# Patient Record
Sex: Male | Born: 1975 | Race: Black or African American | Hispanic: No | Marital: Single | Smoking: Never smoker
Health system: Southern US, Community
[De-identification: ages and names within clinical notes are randomized; demographics above are authoritative.]

## PROBLEM LIST (undated history)

## (undated) DIAGNOSIS — R7401 Elevation of levels of liver transaminase levels: Secondary | ICD-10-CM

## (undated) DIAGNOSIS — R740 Nonspecific elevation of levels of transaminase and lactic acid dehydrogenase [LDH]: Secondary | ICD-10-CM

## (undated) DIAGNOSIS — R030 Elevated blood-pressure reading, without diagnosis of hypertension: Secondary | ICD-10-CM

## (undated) DIAGNOSIS — R002 Palpitations: Secondary | ICD-10-CM

## (undated) HISTORY — DX: Palpitations: R00.2

## (undated) HISTORY — DX: Elevated blood-pressure reading, without diagnosis of hypertension: R03.0

## (undated) HISTORY — DX: Nonspecific elevation of levels of transaminase and lactic acid dehydrogenase (ldh): R74.0

## (undated) HISTORY — DX: Elevation of levels of liver transaminase levels: R74.01

---
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
## 2011-06-13 ENCOUNTER — Emergency Department: Payer: Self-pay | Admitting: Unknown Physician Specialty

## 2011-06-13 ENCOUNTER — Encounter: Payer: Self-pay | Admitting: Internal Medicine

## 2011-06-13 LAB — COMPREHENSIVE METABOLIC PANEL
Albumin: 4.4 g/dL (ref 3.4–5.0)
Alkaline Phosphatase: 79 U/L (ref 50–136)
Anion Gap: 11 (ref 7–16)
BUN: 12 mg/dL (ref 7–18)
Bilirubin,Total: 0.3 mg/dL (ref 0.2–1.0)
Chloride: 102 mmol/L (ref 98–107)
Creatinine: 1.04 mg/dL (ref 0.60–1.30)
EGFR (African American): >60
Glucose: 109 mg/dL — ABNORMAL HIGH (ref 65–99)
SGOT(AST): 38 U/L — ABNORMAL HIGH (ref 15–37)
SGPT (ALT): 84 U/L — ABNORMAL HIGH
Total Protein: 8.5 g/dL — ABNORMAL HIGH (ref 6.4–8.2)

## 2011-06-13 LAB — CBC
HGB: 13.6 g/dL (ref 13.0–18.0)
MCH: 30.3 pg (ref 26.0–34.0)
MCHC: 34.1 g/dL (ref 32.0–36.0)
Platelet: 247 10*3/uL (ref 150–440)
RBC: 4.48 10*6/uL (ref 4.40–5.90)
RDW: 13.1 % (ref 11.5–14.5)

## 2011-06-13 LAB — MAGNESIUM: Magnesium: 2.1 mg/dL

## 2011-06-13 LAB — CK TOTAL AND CKMB (NOT AT ARMC)
CK, Total: 354 U/L — ABNORMAL HIGH (ref 35–232)
CK-MB: 3.3 ng/mL (ref 0.5–3.6)

## 2011-06-13 LAB — TROPONIN I: Troponin-I: <0.02 ng/mL

## 2011-08-06 ENCOUNTER — Ambulatory Visit (INDEPENDENT_AMBULATORY_CARE_PROVIDER_SITE_OTHER): Payer: No Typology Code available for payment source | Admitting: Internal Medicine

## 2011-08-06 ENCOUNTER — Encounter: Payer: Self-pay | Admitting: Internal Medicine

## 2011-08-06 VITALS — BP 143/81 | HR 75 | Resp 18 | Ht 72.0 in | Wt 310.0 lb

## 2011-08-06 DIAGNOSIS — R030 Elevated blood-pressure reading, without diagnosis of hypertension: Secondary | ICD-10-CM

## 2011-08-06 DIAGNOSIS — R002 Palpitations: Secondary | ICD-10-CM | POA: Insufficient documentation

## 2011-08-06 MED ORDER — PROPRANOLOL HCL 10 MG PO TABS: ORAL_TABLET | ORAL | Status: DC

## 2011-08-06 NOTE — Patient Instructions (Addendum)
You being referred to : Dr. Leone Payor in the GI department for elevated liver enzymes.  Your physician has requested that you have an echocardiogram in the [place] office. Echocardiography is a painless test that uses sound waves to create images of your heart. It provides your doctor with information about the size and shape of your heart and how well your heart's chambers and valves are working. This procedure takes approximately one hour. There are no restrictions for this procedure.  Your physician has recommended you make the following change in your medication:  1) Start inderal 10 mg one tablet daily as needed for symptoms.

## 2011-08-06 NOTE — Assessment & Plan Note (Signed)
Mildly elevated. He is starting to exercise more. This should be followed up as an outpatient

## 2011-08-06 NOTE — Assessment & Plan Note (Signed)
1 these were repeatedly elevated in January. We will refer him to Dr. Leone Payor for further evaluation.

## 2011-08-06 NOTE — Assessment & Plan Note (Signed)
These are infrequent. The issue would be is there in association with abnormal heart muscle function I would have an impact on prognosis. We'll do an ultrasound. I have told him that if this is normal, it is simply try to whether the symptoms will recur. I will give him a prescription for a low-dose beta blocker to have as needed. Specifically propranolol 10 mg.

## 2011-08-06 NOTE — Progress Notes (Signed)
   History and Physical  Patient ID: Shane Rhodes MRN: [id number], SOB: [birth year] 36 y.o. Date of Encounter: 08/06/2011, 10:45 AM  Primary Physician: No primary provider on file.   Chief Complaint: Palpitations  History of Present Illness: Shane Rhodes is a 36 y.o. male who works for [employer] while traveling to [place] 2 months ago developed palpitations. They persisted and he went to the local emergency room where he was given a diagnosis of PVCs. Apparently one was seen on the monitor "a small 1". ECGs don't document anything. He then had an episode a week or 2 later that prompted him to go to [facility]; again nothing was documented. This had no spells since. These were associated with a heart pounding beat was not rapid. There is a sensation of the heartbeat in his throat. There was no lightheadedness.  He has had no problems with exercise intolerance. There is no edema syncope. He has become a vegetarian in the last 6 months and is starting to run for exercise.  His blood work is also notable for hyper transaminasemia that was noted on both blood tests.   Past Medical History  Diagnosis Date  . 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 Elevated transaminase level   . Borderline blood pressure      Past Surgical History  Procedure Date  . Orthopedic procedure       No current outpatient prescriptions on file.     Allergies: No Known Allergies   History  Substance Use Topics  . Smoking status: Never Smoker   . Smokeless tobacco: Not on file  . Alcohol Use: Yes      Negative FHX   ROS:  Please see the history of present illness.   Negative except anxiety  All other systems reviewed and negative.   Vital Signs: Blood pressure 143/81, pulse 75, resp. rate 18, height 6' (1.829 m), weight 310 lb (140.615 kg).  PHYSICAL EXAM: General:  Well nourished, well developed male in no acute distress  HEENT: normal Lymph: no adenopathy Neck:  no JVD Endocrine:  No thryomegaly Vascular: No carotid bruits; FA pulses 2+ bilaterally without bruits Cardiac:  normal S1, S2; RRR; no murmur Back: without kyphosis/scoliosis, no CVA tenderness Lungs:  clear to auscultation bilaterally, no wheezing, rhonchi or rales Abd: soft, nontender, no hepatomegaly Ext: no edema Musculoskeletal:  No deformities, BUE and BLE strength normal and equal Skin: warm and dry Neuro:  CNs 2-12 intact, no focal abnormalities noted Psych:  Normal affect   EKG:  From [facility] demonstrated sinus rhythm with normal intervals and normal voltage  Labs:      ASSESSMENT AND PLAN:

## 2011-08-24 ENCOUNTER — Other Ambulatory Visit: Payer: Self-pay

## 2011-08-24 ENCOUNTER — Other Ambulatory Visit (INDEPENDENT_AMBULATORY_CARE_PROVIDER_SITE_OTHER): Payer: No Typology Code available for payment source

## 2011-08-24 DIAGNOSIS — R002 Palpitations: Secondary | ICD-10-CM

## 2011-09-03 ENCOUNTER — Ambulatory Visit: Payer: No Typology Code available for payment source | Admitting: Internal Medicine

## 2011-09-24 ENCOUNTER — Encounter: Payer: Self-pay | Admitting: Internal Medicine

## 2011-09-24 ENCOUNTER — Ambulatory Visit (INDEPENDENT_AMBULATORY_CARE_PROVIDER_SITE_OTHER): Payer: No Typology Code available for payment source | Admitting: *Deleted

## 2011-09-24 ENCOUNTER — Ambulatory Visit (INDEPENDENT_AMBULATORY_CARE_PROVIDER_SITE_OTHER): Payer: No Typology Code available for payment source | Admitting: Internal Medicine

## 2011-09-24 VITALS — BP 124/88 | HR 84 | Ht 71.0 in | Wt 301.4 lb

## 2011-09-24 DIAGNOSIS — R748 Abnormal levels of other serum enzymes: Secondary | ICD-10-CM

## 2011-09-24 DIAGNOSIS — R6889 Other general symptoms and signs: Secondary | ICD-10-CM

## 2011-09-24 LAB — HEPATIC FUNCTION PANEL
ALT: 59 U/L — ABNORMAL HIGH (ref 0–53)
AST: 31 U/L (ref 0–37)
Albumin: 4.3 g/dL (ref 3.5–5.2)
Alkaline Phosphatase: 61 U/L (ref 39–117)
Total Bilirubin: 0.8 mg/dL (ref 0.3–1.2)

## 2011-09-24 NOTE — Patient Instructions (Signed)
Your physician has requested that you go to the basement for the following lab work before leaving today: LFT's, CK-Total  We will call you with the results.

## 2011-09-24 NOTE — Progress Notes (Signed)
  Subjective:    Patient ID: Shane Rhodes, male    DOB: [birth year], 35 y.o.   MRN: [id number]  HPI 36 yo AA man was travelling on 06/11/2011 and developed palpitations. He went from [place] to [place] to [place]. Went to [facility] in [place] where EKG was ok. Labs showed ALT 60 (top normal 52) and normal AST. Troponin ok. Felt better and returned here. [facility] labs 06/13/11 show ALT 84 (top normal 78) and AST 38 9top normal 37). A CK was elevated at 354 (232 top normal). He had been running regularly training for a 5 K during this time. No overt myalgias, though. No steroids, body-building supplements or weight-lifting. Rare and not much EtOH. No prior hx abnormal LFT's or liver disease.No IVDA, no transfusions.   Medications, allergies, past medical history, past surgical history, family history and social history are reviewed and updated in the EMR.  All other GI ROS negative. Review of Systems All other ROS negative except as in HPI    Objective:   Physical Exam General:  Well-developed, well-nourished and in no acute distress-obese Eyes:  anicteric. ENT:   Mouth and posterior pharynx free of lesions.  Neck:   supple w/o thyromegaly or mass.  Lungs: Clear to auscultation bilaterally. Heart:  S1S2, no rubs, murmurs, gallops. Abdomen:  soft, non-tender, no hepatosplenomegaly, hernia, or mass and BS+.  Lymph:  no cervical or supraclavicular adenopathy. Extremities:   no edema Neuro:  A&O x 3.  Psych:  appropriate mood and  Affect.   Data Reviewed:  Labs, EKG, ED notes from [facility] 06/11/2011 Labs from [facility] 06/13/11      Assessment & Plan:   1. Abnormal transaminases   2. Abnormal creatine kinase level    At this point suspect abnormal transaminases originated in skeletal muscle, perhaps from the running he was doing then. Will reassess LFT's and CK. Also consider non-alcoholic fatty liver as he is obese. If labs normal then probably one more repeat  lab test in a few months. If labs not normal may need further testing.  Lab Results  Component Value Date   ALT 59* 09/24/2011   AST 31 09/24/2011   ALKPHOS 61 09/24/2011   BILITOT 0.8 09/24/2011   Lab Results  Component Value Date   CKTOTAL 213 09/24/2011   CKMB 2.0 09/24/2011   Labs have returned with persistent mild abnormality of the ALT. Will contact him and check HCV antibody, fasting lipid panel, ferritin, ANA, Hep B surface Ag.   Cc: Duke Salvia, MD [address] [place]lyn [place], [place] [address]

## 2011-09-25 ENCOUNTER — Encounter: Payer: Self-pay | Admitting: Internal Medicine

## 2011-09-25 NOTE — Progress Notes (Signed)
Quick Note:  Let him know one of the liver tests still mildly elevated and the CK (muscle test) is normal So could have some liver issue  I want him to do a fasting lipid panel, Hepatitis B surface antigen, Hepatitis C antibody, ANA and a ferritin ______

## 2011-09-27 NOTE — Progress Notes (Signed)
Pt notified of lab results and informed we need more labs drawn per Dr. Leone Payor, pt will try and come today fasting.  Orders put in computer for fasting lipid panel, Hep. B surface antigen, Hep. C antibody, ANA, Ferritin.

## 2011-09-28 ENCOUNTER — Other Ambulatory Visit (INDEPENDENT_AMBULATORY_CARE_PROVIDER_SITE_OTHER): Payer: No Typology Code available for payment source

## 2011-09-28 DIAGNOSIS — R748 Abnormal levels of other serum enzymes: Secondary | ICD-10-CM

## 2011-09-28 LAB — LIPID PANEL: Cholesterol: 184 mg/dL (ref 0–200)

## 2011-09-28 LAB — HEPATITIS C ANTIBODY: HCV Ab: NEGATIVE

## 2011-09-29 LAB — ANA: Anti Nuclear Antibody(ANA): NEGATIVE

## 2011-09-30 NOTE — Progress Notes (Signed)
Quick Note:  Let him know that lab work-up negative for hep C and B Lipids not bad though LDL somewhat high Other tests (ferritin and ANA)are ok - no iron overload or autoimmune Abnormal tests most likely from fatty liver which is likely given his size [place] could show but I do not think he needs it unless desired  Recommend repeat LFT's in 3 months time and will call results and plans - overall do not think anything serious going on but we should follow-uop  Let me know if he has ?'s Send him fatty liver info ______

## 2011-10-01 ENCOUNTER — Other Ambulatory Visit: Payer: Self-pay

## 2011-10-01 DIAGNOSIS — R933 Abnormal findings on diagnostic imaging of other parts of digestive tract: Secondary | ICD-10-CM

## 2014-04-16 ENCOUNTER — Other Ambulatory Visit: Payer: Self-pay | Admitting: Orthopedic Surgery

## 2014-04-16 DIAGNOSIS — M25511 Pain in right shoulder: Secondary | ICD-10-CM

## 2014-04-29 ENCOUNTER — Ambulatory Visit
Admission: RE | Admit: 2014-04-29 | Discharge: 2014-04-29 | Disposition: A | Discharge status: Home / Self Care | Payer: BC Managed Care – PPO | Source: Ambulatory Visit | Attending: Orthopedic Surgery | Admitting: Orthopedic Surgery

## 2014-04-29 ENCOUNTER — Other Ambulatory Visit: Payer: Self-pay | Admitting: Orthopedic Surgery

## 2014-04-29 DIAGNOSIS — M25511 Pain in right shoulder: Secondary | ICD-10-CM

## 2014-04-29 MED ORDER — IOHEXOL 180 MG/ML  SOLN: 15.0000 mL | Freq: Once | INTRAMUSCULAR | Status: AC | PRN

## 2014-04-30 ENCOUNTER — Other Ambulatory Visit: Payer: Self-pay | Admitting: Orthopedic Surgery

## 2014-04-30 DIAGNOSIS — M25511 Pain in right shoulder: Secondary | ICD-10-CM

## 2014-04-30 DIAGNOSIS — M2560 Stiffness of unspecified joint, not elsewhere classified: Secondary | ICD-10-CM

## 2014-05-03 ENCOUNTER — Other Ambulatory Visit: Payer: No Typology Code available for payment source

## 2014-05-03 ENCOUNTER — Ambulatory Visit
Admission: RE | Admit: 2014-05-03 | Discharge: 2014-05-03 | Disposition: A | Discharge status: Home / Self Care | Payer: BC Managed Care – PPO | Source: Ambulatory Visit | Attending: Orthopedic Surgery | Admitting: Orthopedic Surgery

## 2014-05-03 ENCOUNTER — Inpatient Hospital Stay: Admission: RE | Admit: 2014-05-03 | Payer: No Typology Code available for payment source | Source: Ambulatory Visit

## 2014-05-03 DIAGNOSIS — M2560 Stiffness of unspecified joint, not elsewhere classified: Secondary | ICD-10-CM

## 2014-05-03 DIAGNOSIS — M25511 Pain in right shoulder: Secondary | ICD-10-CM

## 2014-05-03 MED ORDER — IOHEXOL 180 MG/ML  SOLN
15.0000 mL | Freq: Once | INTRAMUSCULAR | Status: AC | PRN
  Administered 2014-05-03: 15 mL via INTRA_ARTICULAR

## 2014-06-28 ENCOUNTER — Other Ambulatory Visit: Payer: Self-pay | Admitting: Physician Assistant

## 2014-06-28 DIAGNOSIS — R609 Edema, unspecified: Secondary | ICD-10-CM | POA: Insufficient documentation

## 2014-06-29 ENCOUNTER — Ambulatory Visit (HOSPITAL_COMMUNITY)
Admission: RE | Admit: 2014-06-29 | Discharge: 2014-06-29 | Disposition: A | Discharge status: Home / Self Care | Payer: BC Managed Care – PPO | Source: Ambulatory Visit | Attending: Physician Assistant | Admitting: Physician Assistant

## 2014-06-29 DIAGNOSIS — R609 Edema, unspecified: Secondary | ICD-10-CM

## 2014-06-29 NOTE — Progress Notes (Signed)
VASCULAR LAB PRELIMINARY  PRELIMINARY  PRELIMINARY  PRELIMINARY  Bilateral lower extremity venous Dopplers completed.    Preliminary report: There is no DVT or SVT noted in the bilateral lower extremities.   Danielys Madry, RVT 06/29/2014, 10:42 AM

## 2015-03-19 ENCOUNTER — Other Ambulatory Visit: Payer: Self-pay | Admitting: Orthopedic Surgery

## 2015-03-19 DIAGNOSIS — M25562 Pain in left knee: Secondary | ICD-10-CM

## 2015-03-29 ENCOUNTER — Other Ambulatory Visit: Payer: BC Managed Care – PPO

## 2015-03-30 ENCOUNTER — Inpatient Hospital Stay
Admission: RE | Admit: 2015-03-30 | Discharge: 2015-03-30 | Disposition: A | Discharge status: Home / Self Care | Payer: BC Managed Care – PPO | Source: Ambulatory Visit | Attending: Orthopedic Surgery | Admitting: Orthopedic Surgery

## 2015-04-13 ENCOUNTER — Ambulatory Visit
Admission: RE | Admit: 2015-04-13 | Discharge: 2015-04-13 | Disposition: A | Discharge status: Home / Self Care | Payer: BC Managed Care – PPO | Source: Ambulatory Visit | Attending: Orthopedic Surgery | Admitting: Orthopedic Surgery

## 2015-04-13 DIAGNOSIS — M25562 Pain in left knee: Secondary | ICD-10-CM

## 2017-07-07 IMAGING — MR MR KNEE*L* W/O CM
4 of 5 series · 29 of 40 positions shown · non-contrast
Comparison: None.

CLINICAL DATA: Left knee swelling and weakness for 2 months.
Swelling is mainly posterior. No known injury. Initial encounter.

EXAM:
MRI OF THE LEFT KNEE WITHOUT CONTRAST
TECHNIQUE: Multiplanar, multisequence MR imaging of the knee was performed. No
intravenous contrast was administered.

[Series 3: PD fat-sat · axial · 4.0mm · 0.66mm/px · z∈[-29,+81]mm · 9 of 26 slices shown (1 of 3)]
[im 1/26]
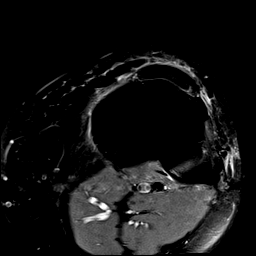
[im 4/26]
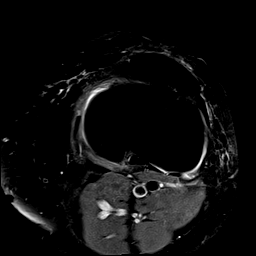
[im 7/26]
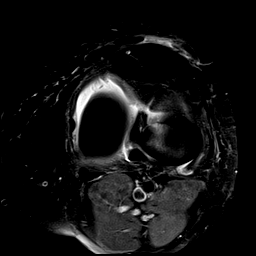
[im 10/26]
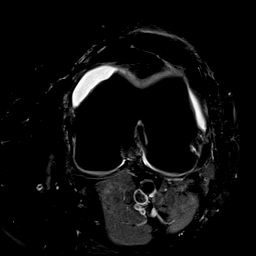
[im 13/26]
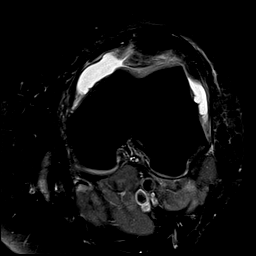
[im 16/26]
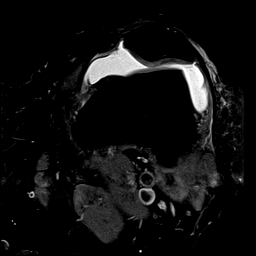
[im 19/26]
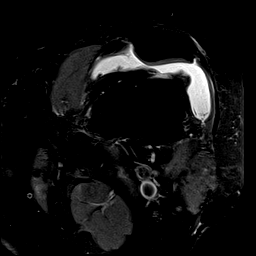
[im 22/26]
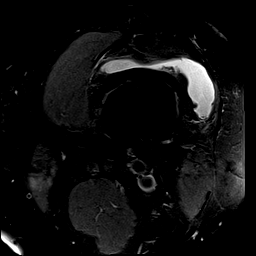
[im 26/26]
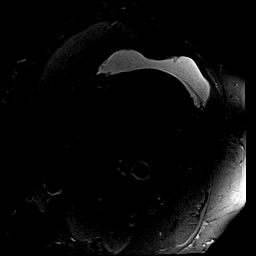

[Series 4: T2 fat-sat · coronal · 4.0mm · 0.31mm/px · 5 of 27 slices shown]
[im 1/27]
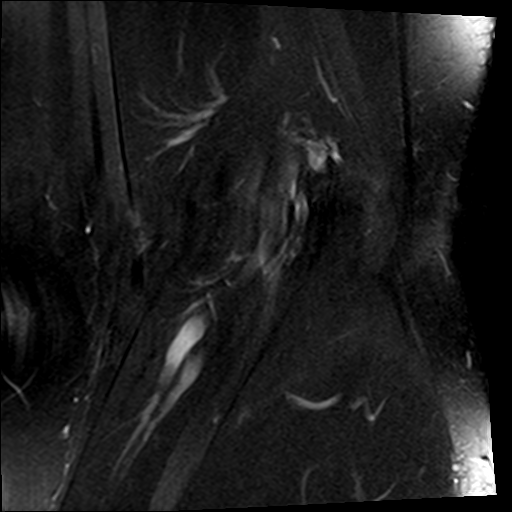
[im 4/27]
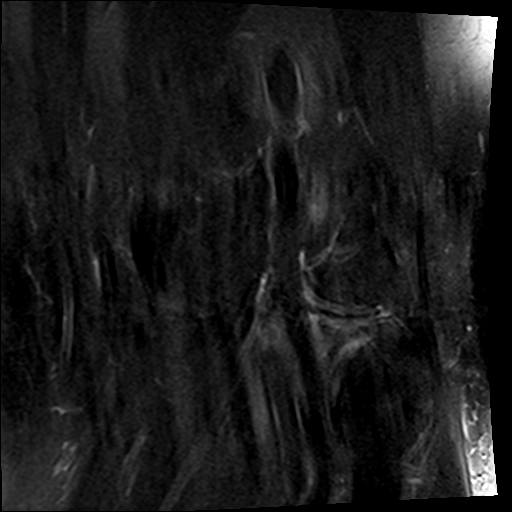
[im 8/27]
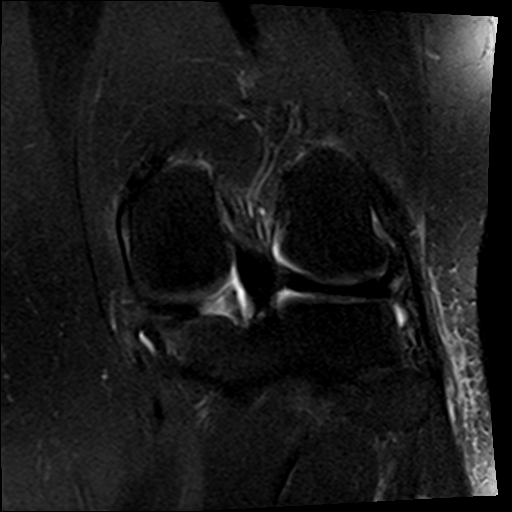
[im 15/27]
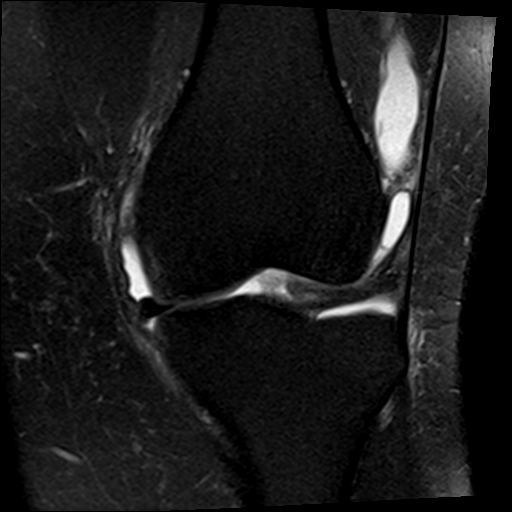
[im 23/27]
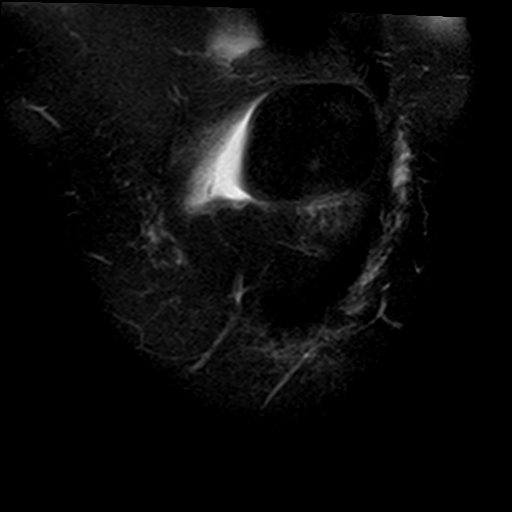

[Series 6: PD fat-sat · sagittal · 4.0mm · 0.50mm/px · 7 of 22 slices shown (2 of 3)]
[im 1/22]
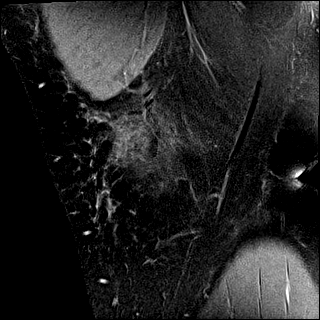
[im 4/22]
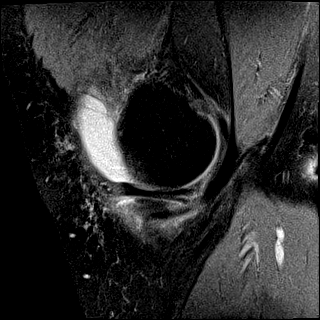
[im 8/22]
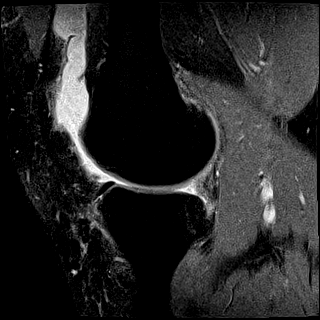
[im 11/22]
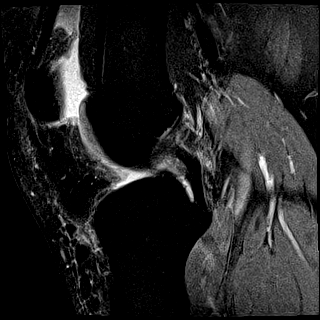
[im 15/22]
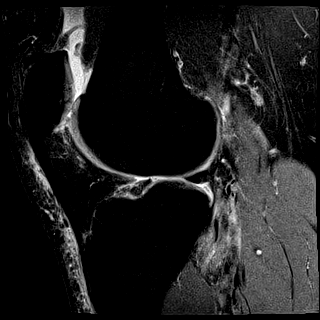
[im 18/22]
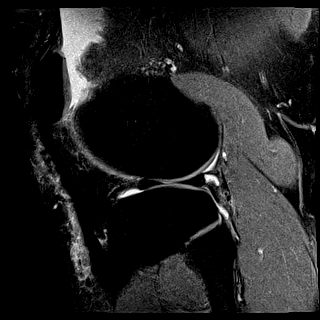
[im 22/22]
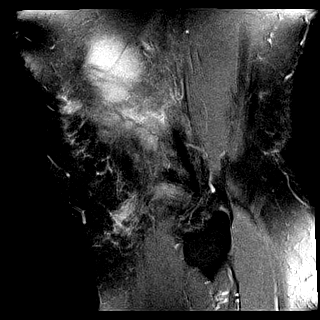

[Series 7: PD fat-sat · coronal · 4.0mm · 0.31mm/px · 8 of 27 slices shown (3 of 3)]
[im 1/27]
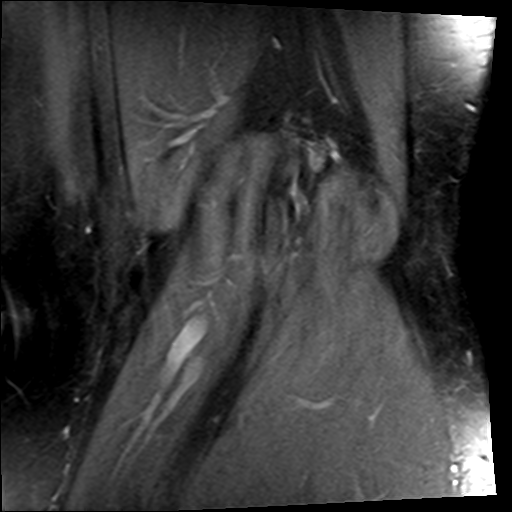
[im 4/27]
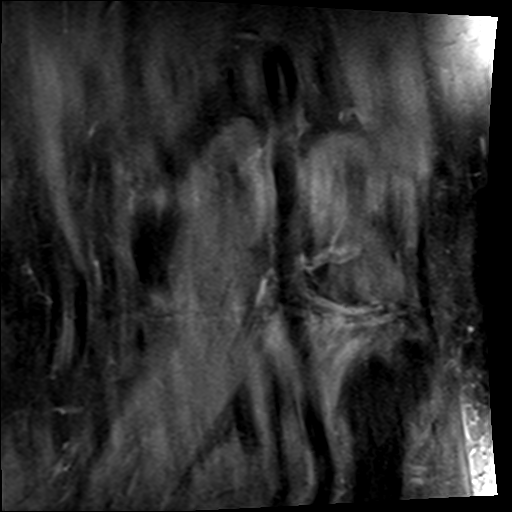
[im 8/27]
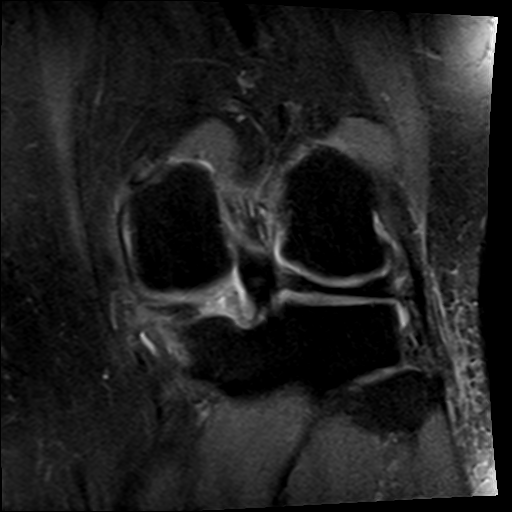
[im 12/27]
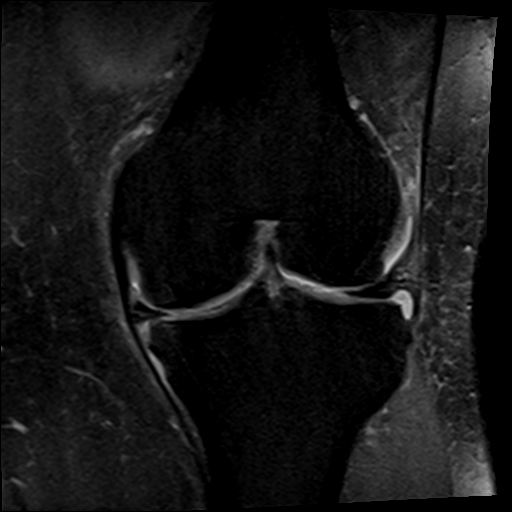
[im 15/27]
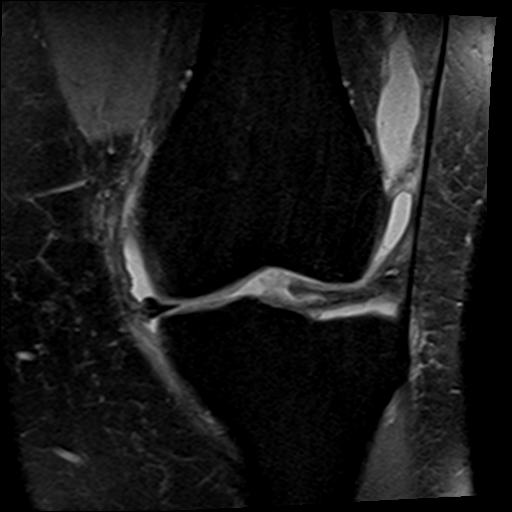
[im 19/27]
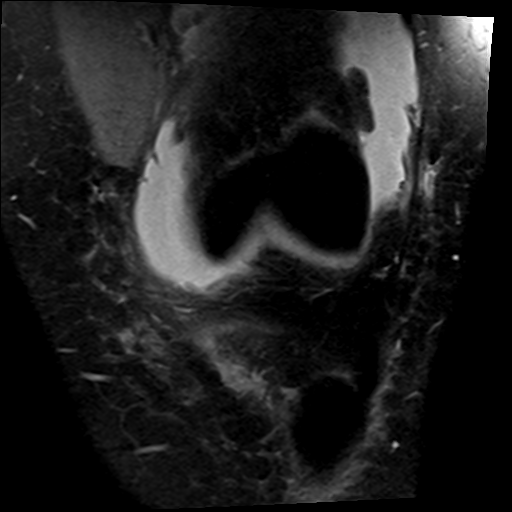
[im 23/27]
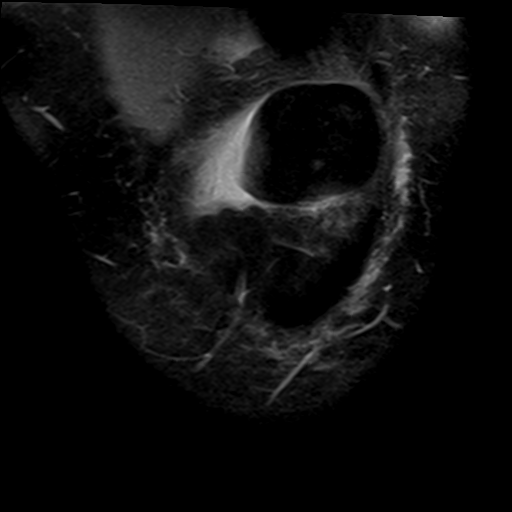
[im 27/27]
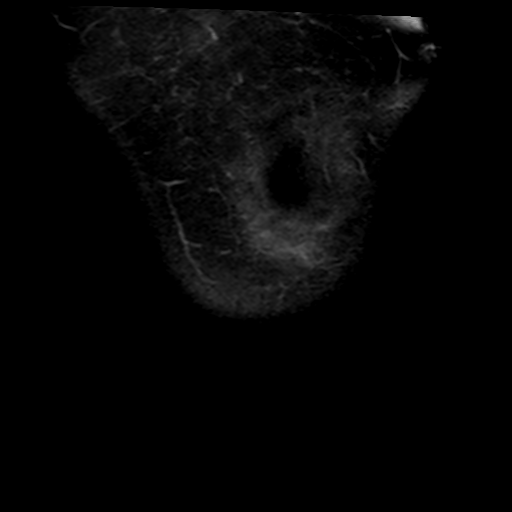

[29 of 40 positions shown; findings below may reference images not displayed]

FINDINGS: MENISCI

Medial meniscus: There is a large radial tear in the posterior horn
of the medial meniscus just peripheral to the meniscal root. The
body of the medial meniscus is extruded peripherally. A horizontal
tear is seen in the body reaching the capsular surface but no tear
reaching an articular surface is present in the body.

Lateral meniscus:  Intact.

LIGAMENTS

Cruciates:  Intact.

Collaterals:  Intact.

CARTILAGE

Patellofemoral: There is mild lateral subluxation of the patella.
Irregularity of the surface of hyaline cartilage along the lateral
patellar facet is identified without underlying reactive marrow
signal change.

Medial:  Minimally degenerated.

Lateral:  Minimally degenerated.

Joint:  Moderate joint effusion.

Popliteal Fossa:  No Baker's cyst.

Extensor Mechanism:  Intact.

Bones:  Normal marrow signal throughout.
IMPRESSION: Large radial tear in the posterior horn of the medial meniscus
peripheral to the meniscal root. The body of the medial meniscus is
extruded peripherally with tearing along its capsular surface.

Early/mild degenerative disease about the knee.
